# Patient Record
Sex: Female | Born: 1975 | Race: White | Hispanic: No | Marital: Married | Smoking: Never smoker
Health system: Southern US, Community
[De-identification: ages and names within clinical notes are randomized; demographics above are authoritative.]

## PROBLEM LIST (undated history)

## (undated) DIAGNOSIS — F32A Depression, unspecified: Secondary | ICD-10-CM

## (undated) DIAGNOSIS — F329 Major depressive disorder, single episode, unspecified: Secondary | ICD-10-CM

---

## 2012-08-02 ENCOUNTER — Other Ambulatory Visit (HOSPITAL_COMMUNITY): Payer: Self-pay | Admitting: Specialist

## 2012-08-02 DIAGNOSIS — O09529 Supervision of elderly multigravida, unspecified trimester: Secondary | ICD-10-CM

## 2012-08-02 DIAGNOSIS — Z3689 Encounter for other specified antenatal screening: Secondary | ICD-10-CM

## 2012-08-02 DIAGNOSIS — Z818 Family history of other mental and behavioral disorders: Secondary | ICD-10-CM

## 2012-08-30 ENCOUNTER — Other Ambulatory Visit: Payer: Self-pay

## 2012-09-24 ENCOUNTER — Encounter (HOSPITAL_COMMUNITY): Payer: Self-pay | Admitting: Specialist

## 2012-10-01 ENCOUNTER — Ambulatory Visit (HOSPITAL_COMMUNITY)
Admission: RE | Admit: 2012-10-01 | Discharge: 2012-10-01 | Disposition: A | Discharge status: Home / Self Care | Payer: No Typology Code available for payment source | Source: Ambulatory Visit | Attending: *Deleted | Admitting: *Deleted

## 2012-10-01 ENCOUNTER — Encounter (HOSPITAL_COMMUNITY): Payer: Self-pay

## 2012-10-01 ENCOUNTER — Ambulatory Visit (HOSPITAL_COMMUNITY)
Admission: RE | Admit: 2012-10-01 | Discharge: 2012-10-01 | Disposition: A | Discharge status: Home / Self Care | Payer: No Typology Code available for payment source | Source: Ambulatory Visit | Attending: Specialist | Admitting: Specialist

## 2012-10-01 VITALS — BP 126/70 | HR 89 | Wt 257.0 lb

## 2012-10-01 DIAGNOSIS — Z818 Family history of other mental and behavioral disorders: Secondary | ICD-10-CM

## 2012-10-01 DIAGNOSIS — Z3689 Encounter for other specified antenatal screening: Secondary | ICD-10-CM

## 2012-10-01 DIAGNOSIS — O09529 Supervision of elderly multigravida, unspecified trimester: Secondary | ICD-10-CM | POA: Insufficient documentation

## 2012-10-01 NOTE — Progress Notes (Signed)
Tamara Kirby  was seen today for an ultrasound appointment.  See full report in [facility].  [name] was seen today due to advanced maternal age and history of previous IUFD.  See separate consult for further recommendations.  The patient reports that she had a low risk NIPT (Harmony test).  Limited views of the fetal heart and face were obtained due to maternal body habitus and fetal position.  Single IUP at 18 0/7 weeks No fetal anomalies detected.  Limited views of the heart and face were obtained. No markers associated with aneuploidy noted. A right lateral placenta previa is noted (transbadomnal) Normal amniotic fluid volume  Recommend follow-up ultrasound examination in 4 weeks to reevaluate the fetal heart and face. Will reevaluate placenta location at that time.   Alpha Gula, MD

## 2012-10-01 NOTE — Progress Notes (Signed)
Maternal Fetal Medicine Consultation  Requesting Provider(s): [facility], [place]  Primary OB: Dr Wiliam Ke Reason for consultation: AMA, and previous IUFD at 34 weeks.  HPI:37 yo G3P1101 at  18+ weeks, by LMP c/w 1st trimester U/S, with AMA, and previous IUFD at 34 weeks.  OB History: G1 FT C/S failed induction, 7.10lbs, no complications. G2 34 weeks C/S: per patient request. IUFD. IUFD: Working as a Engineer, civil (consulting), was on duty for several weeks and felt shortness of breath, palpitations, and fatigued.  Was at work and went to ER for feeling abnormal symptoms and sob and was d/ch home stable.  Denied fever or flu like illness. Denied any VB or trauma in the pregnancy. Knew something was wrong and began to notice decreased fetal movement, and notified her OB who ordered a NST (patient reported that it was 'fine').   the next day IUFD was documented because it had not moved from the time she left the Dr office.  Born [place], [place], and doesn't remember a phospholipid work up being performed.   PMH: Denies any bleeding or clotting disorders, depression and anxiety, palpitations. Is followed by counselor, and denies suicidal or homicidal ideation. States she is happy, but anxious about pregnancy secondary to previous outcome.  PSH: Adenoids, Tues in ears, ear drum repair-hole from tubes, CS x2 Meds: Celexa 40mg , Zantacand Zyrtec OTC. PNV  Allergies: NKDA FH: sister in laws baby died at 3 months- from an unknown syndrome. Had normal appearing facies but was sick. Soc: Denies tobacco, ethanol, and drug use. Married and works as a Engineer, civil (consulting).   Review of Systems: no vaginal bleeding or cramping/contractions, no LOF, no nausea/vomiting. All other systems reviewed and are negative.  PNL: see chart, TSH normal 2.16   PE:  VS: BP                    pulse                   weight GEN: well-appearing female ABD: gravid, NT  Please see separate document for fetal ultrasound report.  A/P: 37  yo G3P1101 at  18+0/7 weeks, by LMP c/w 1st trimester U/S, with AMA, palpitations, and previous IUFD at 34 weeks.  Unexplained previous IUFD at 34 weeks: Various possible causes of fetal demise including but not limited to infections, chromosomal and genetic abnormalities, smoking, maternal medical disorders, cord event, placental abruption, pre eclampsia, and others. I have no records or work up for fetal or maternal complications that may have resulted in the fetal death, therefore I cannot fully counsel her or accurate recurrence risk.   The patient was counseled on the recurrence risk based on the IUFD being unexplained, and recommendations were made for :   -Antiphospholipid antibodies (lupus anticoagulant, anticardiolipin antibodies, Beta 2 glycoprotein antibodies) studies were reviewed and found to be normal range. We do not recommend heparin or Lovenox at this time.  -If phospholipid antibody tests return, we can consider Asprin 81mg  daily. Based on limited data, there appears to be some benefit if initiated in early pregnancy. She understands our discussion of risks and unproven benefits.  -Fetal surveillance should be with interval serial growth ultrasounds (every 4-6 weeks). If unable to completely evaluate fetal heart, we recommend fetal echocardiography. -Antepartum testing to begin at 28 weeks, as a weekly BPP. No later than 32 weeks change monitoring to twice weekly non-stress tests, with weekly AFI, or twice weekly biophysical profile assessment. Testing may be initiated prior  to stated dates if any abnormalities of growth, fluid, placental blood flow, or worsening maternal status are discovered.  -Fetal kick counts to begin at 24-26 weeks, were reviewed as well as documentation of at least 4-6 movements/hour, with presentation to L&D with any abnormality in normal movements or a decrease in amount of activity.  -We do not recommend delivery prior to 39 weeks in the absence of other  complications or in the absence of evaluation of positive fetal lung maturity. However, if extreme anxiety develops, as well as emotional instability, we would recommend delivery at 37-38 weeks.   Palpitations:  At this time, we recommend Holter monitoring and referral to adult cardiology for further management.  If abnormal CV, pulmonary, or CNS symptoms develop, she was advised to go immediately to the ER for evaluation.   AMA: Awaiting Harmony cffDNA testing, which the patient reported was negative.  Patient is scheduled to have a consultation with our [facility] today. Please refer to their letter for further details.  Case reviewed with Dr Claudean Severance.  Thank you for the opportunity to be a part of the care of Tamara Kirby.  We will continue to follow her with ultrasounds in our [facility]. Please contact our office if we can be of further assistance.   I spent approximately 30 minutes with this patient with over 50% of time spent in face-to-face counseling.

## 2012-10-01 NOTE — Progress Notes (Addendum)
Genetic Counseling  High-Risk Gestation Note  Appointment Date: 10/01/2012  Referred By: Roland Earl, MD  Date of Birth: [birth year] Partner: Tamara Kirby   Pregnancy History: G3P2A0 Estimated Date of Delivery: 03/04/13  Estimated Gestational Age: [redacted]w[redacted]d  Attending: Alpha Gula, MD  [name], and her partner, [name], were seen for genetic counseling regarding a maternal age of 13 and family history concerns.  They were counseled regarding maternal age and the association with risk for chromosome conditions due to nondisjunction with aging of the ova.   We reviewed chromosomes, nondisjunction, and the associated 1 in 100 risk for fetal aneuploidy related to a maternal age of 89 at [redacted]w[redacted]d weeks gestation. They were counseled that the risk for aneuploidy decreases as gestational age increases, accounting for those pregnancies which spontaneously abort.  We specifically discussed Down syndrome (trisomy 59), trisomies 45 and 75, and sex chromosome aneuploidies (47,XXX and 47,XXY) including the common features and prognoses of each.   We also reviewed Ms. McDowell's noninvasive prenatal testing result (NIPT).  [name] had Harmony through [facility].  We reviewed that this technology analyzes cell free fetal DNA found in the maternal circulation. They were counseled that NIPT (Harmony) is not diagnostic for fetal aneuploidy, but can provide information regarding the presence or absence of extra or missing fetal DNA for chromosomes 13, 18, 21, and X and Y.  It does not identify or rule out all genetic conditions. We reviewed the specific detection and false positive rates for each condition the technology evaluates.  We discussed other available screening and diagnostic options including detailed ultrasound and amniocentesis. They were counseled that ~50-80% of fetuses with Down syndrome and up to 90-95% of fetuses with trisomy 18/13, when well visualized, have  detectable anomalies or soft markers by detailed ultrasound (~18+ weeks gestation). They were then counseled regarding diagnostic testing via amniocentesis.  We reviewed the approximate 1 in 300-500 risk for complications for amniocentesis, including spontaneous pregnancy loss. We discussed the risks, limitations, and benefits of each screening and testing option.  Given the normal Harmony result, they elected to proceed with a detailed anatomy ultrasound, but declined amniocentesis. A detailed ultrasound was performed today.  The report will be documented separately.  [name] was provided with written information regarding cystic fibrosis (CF) including the carrier frequency and incidence in the Caucasian population, the availability of carrier testing and prenatal diagnosis if indicated.  In addition, we discussed that CF is routinely screened for as part of the [place] newborn screening panel.  She declined testing today.   Both family histories were reviewed and found to be contributory.  Tamara Kirby reported that his brother has a chromosome condition involving chromosomes 9 and 14.  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  We reviewed chromosomes and reciprocal translocations including both balanced and unbalanced states.  Medical records were not available to verify the reported history.  This relative reportedly has severe mental retardation and dysmorphic features.  It is uncertain whether he has been followed by a geneticist in the past or where his chromosome analysis was performed.  Tamara Kirby agreed to discuss this history with his family and to obtain records if possible.  He remembers being told that the condition occurred sporadically.  He is not certain if his parents had chromosome testing.  We also discussed the option of peripheral blood chromosome analysis to determine if Tamara Kirby has an abnormal karyotype.  He understands  that  without more information, an accurate risk assessment for the fetus cannot be provided.  This couple was provided with CPT codes for peripheral blood chromosome analysis to determine if the testing is covered by their insurance plan.  They agreed to contact me if they are able to provide medical records from the affected relative or if Tamara Kirby is interested in pursuing chromosome analysis for himself.    In addition, [name] reported that her brother had a son who died at 53 months of age from complications of hypoparathyroidism.  By report, this relative had Kenny-Caffey syndrome.  We discussed that this is a rare autosomal recessive condition characterized by intrauterine and postnatal growth restriction, endocrine abnormalities (hypoparathyroidism), recurrent bacterial infections, and bone abnormalities.  Given the reported family history, [name] has a 1:2 chance of being a carrier of the condition.  It is not known whether or not the deceased child had genetic testing for Kenny-Caffey or if his diagnosis was based on clinical features.  We discussed the option of genetic testing.  Given that the general population carrier frequency is less than 1:100, the risk to the fetus is estimated to be <1:800.  After thoughtful consideration of this information, this couple declined further discussion and testing at this time.  They were encouraged to notify their child's pediatrician about this family history because of the associated risk for neonatal hypoparathyroidism and hypocalcemia.  The remainder of the family history was noncontributory for birth defects, mental retardation, and known genetic conditions. Without further information regarding the provided family history, an accurate genetic risk cannot be calculated. Further genetic counseling is warranted if more information is obtained.  [name] denied exposure to environmental toxins or chemical agents. She denied the use of alcohol,  tobacco or street drugs. She denied significant viral illnesses during the course of her pregnancy. [name] reported that she had a 35 week stillbirth.  She met with Dr. Alinda Dooms for MFM consult today.  Please see that note for a complete review of this patient's medical/pregnancy history.  I counseled this couple regarding the above risks and available options.  The approximate face-to-face time with the genetic counselor was 48 minutes.  Donald Prose, MS Certified Genetic Counselor

## 2012-10-02 LAB — LUPUS ANTICOAGULANT PANEL: Lupus Anticoagulant: NOT DETECTED

## 2012-10-02 LAB — BETA-2-GLYCOPROTEIN I ABS, IGG/M/A
Beta-2 Glyco I IgG: 0 G Units (ref <20)
Beta-2-Glycoprotein I IgA: 4 A Units (ref <20)
Beta-2-Glycoprotein I IgM: 6 M Units (ref <20)

## 2012-10-02 LAB — CARDIOLIPIN ANTIBODIES, IGG, IGM, IGA: Anticardiolipin IgA: 2 APL U/mL — ABNORMAL LOW (ref <22)

## 2012-10-09 ENCOUNTER — Telehealth (HOSPITAL_COMMUNITY): Payer: Self-pay | Admitting: *Deleted

## 2012-10-09 NOTE — Telephone Encounter (Signed)
Called pt with her lab results.  Reviewed with Dr. Sherrie George and found to be wnl.  Pt verbalized understanding.

## 2012-10-29 ENCOUNTER — Other Ambulatory Visit (HOSPITAL_COMMUNITY): Payer: Self-pay | Admitting: Maternal and Fetal Medicine

## 2012-10-29 ENCOUNTER — Ambulatory Visit (HOSPITAL_COMMUNITY)
Admission: RE | Admit: 2012-10-29 | Discharge: 2012-10-29 | Disposition: A | Discharge status: Home / Self Care | Payer: No Typology Code available for payment source | Source: Ambulatory Visit | Attending: Specialist | Admitting: Specialist

## 2012-10-29 VITALS — BP 122/65 | HR 89 | Wt 263.0 lb

## 2012-10-29 DIAGNOSIS — Z3689 Encounter for other specified antenatal screening: Secondary | ICD-10-CM

## 2012-10-29 DIAGNOSIS — O09529 Supervision of elderly multigravida, unspecified trimester: Secondary | ICD-10-CM | POA: Insufficient documentation

## 2012-10-29 DIAGNOSIS — O09299 Supervision of pregnancy with other poor reproductive or obstetric history, unspecified trimester: Secondary | ICD-10-CM | POA: Insufficient documentation

## 2012-10-29 DIAGNOSIS — O34219 Maternal care for unspecified type scar from previous cesarean delivery: Secondary | ICD-10-CM | POA: Insufficient documentation

## 2012-10-29 DIAGNOSIS — O4400 Placenta previa specified as without hemorrhage, unspecified trimester: Secondary | ICD-10-CM

## 2012-10-29 DIAGNOSIS — Z818 Family history of other mental and behavioral disorders: Secondary | ICD-10-CM

## 2012-10-29 NOTE — Progress Notes (Signed)
Tamara Kirby  was seen today for an ultrasound appointment.  See full report in [facility].  Impression Single IUP at 22 0/7 weeks Normal fetal growth for gestational age 36%-tile. No fetal anomalies detected.   Limited views of the fetal face were obtained. No markers associated with aneuploidy noted. A right lateral placenta previa is noted  Normal amniotic fluid volume.  Recommendations Recommend follow-up ultrasound examination in 4 weeks to reevaluate the fetal face, and growth. Will reevaluate placenta location with transvaginal imaging at that time.  Alpha Gula, MD

## 2012-11-26 ENCOUNTER — Ambulatory Visit (HOSPITAL_COMMUNITY)
Admission: RE | Admit: 2012-11-26 | Discharge: 2012-11-26 | Disposition: A | Discharge status: Home / Self Care | Payer: No Typology Code available for payment source | Source: Ambulatory Visit | Attending: Specialist | Admitting: Specialist

## 2012-11-26 ENCOUNTER — Encounter (HOSPITAL_COMMUNITY): Payer: Self-pay

## 2012-11-26 DIAGNOSIS — O09529 Supervision of elderly multigravida, unspecified trimester: Secondary | ICD-10-CM | POA: Insufficient documentation

## 2012-11-26 DIAGNOSIS — O09299 Supervision of pregnancy with other poor reproductive or obstetric history, unspecified trimester: Secondary | ICD-10-CM | POA: Insufficient documentation

## 2012-11-26 DIAGNOSIS — O34219 Maternal care for unspecified type scar from previous cesarean delivery: Secondary | ICD-10-CM | POA: Insufficient documentation

## 2012-11-26 DIAGNOSIS — Z3689 Encounter for other specified antenatal screening: Secondary | ICD-10-CM

## 2012-11-26 NOTE — Progress Notes (Signed)
[facility]  Indication: 37 yr old G63P1101 at [redacted]w[redacted]d with history of fetal demise for follow up fetal growth and evaluate placental location.  Findings: 1. Single intrauterine pregnancy. 2. Estimated fetal weight is in the 68th%. 3. Right lateral placenta without evidence of previa. 4. Normal amniotic fluid volume. 5. Normal transvaginal cervical length. 6. The limited anatomy survey is normal.  Recommendations: 1. Appropriate fetal growth. 2. Previous fetal demise: - previously counseled - antiphospholipid antibody work up negative - recommend fetal growth every 4 weeks - recommend antenatal testing starting at 28-32 weeks as previously recommended; patient prefers BPPs - do not recommend delivery prior to 39 weeks without other complications; if there is patient anxiety can consider delivery at 37-39 weeks with amniocentesis for fetal lung maturity 3. Placenta previa has resolved 4. Advanced maternal age: - previously counseled - had normal Harmony screen  Eulis Foster, MD

## 2013-08-06 ENCOUNTER — Encounter (HOSPITAL_COMMUNITY): Payer: Self-pay | Admitting: *Deleted

## 2014-01-13 ENCOUNTER — Encounter (HOSPITAL_COMMUNITY): Payer: Self-pay | Admitting: Specialist

## 2014-01-13 ENCOUNTER — Other Ambulatory Visit (HOSPITAL_COMMUNITY): Payer: Self-pay | Admitting: Specialist

## 2014-01-13 DIAGNOSIS — O09529 Supervision of elderly multigravida, unspecified trimester: Secondary | ICD-10-CM

## 2014-01-13 DIAGNOSIS — Z3689 Encounter for other specified antenatal screening: Secondary | ICD-10-CM

## 2014-01-14 ENCOUNTER — Encounter (HOSPITAL_COMMUNITY): Payer: Self-pay | Admitting: Specialist

## 2014-01-21 ENCOUNTER — Other Ambulatory Visit: Payer: Self-pay

## 2014-02-25 ENCOUNTER — Ambulatory Visit (HOSPITAL_COMMUNITY): Admission: RE | Admit: 2014-02-25 | Payer: No Typology Code available for payment source | Source: Ambulatory Visit

## 2014-02-25 ENCOUNTER — Ambulatory Visit (HOSPITAL_COMMUNITY)
Admission: RE | Admit: 2014-02-25 | Discharge: 2014-02-25 | Disposition: A | Discharge status: Home / Self Care | Payer: No Typology Code available for payment source | Source: Ambulatory Visit | Attending: *Deleted | Admitting: *Deleted

## 2014-02-25 ENCOUNTER — Other Ambulatory Visit (HOSPITAL_COMMUNITY): Payer: Self-pay | Admitting: *Deleted

## 2014-02-25 ENCOUNTER — Ambulatory Visit (HOSPITAL_COMMUNITY)
Admission: RE | Admit: 2014-02-25 | Discharge: 2014-02-25 | Disposition: A | Discharge status: Home / Self Care | Payer: No Typology Code available for payment source | Source: Ambulatory Visit | Attending: Specialist | Admitting: Specialist

## 2014-02-25 ENCOUNTER — Encounter (HOSPITAL_COMMUNITY): Payer: Self-pay

## 2014-02-25 ENCOUNTER — Encounter (HOSPITAL_COMMUNITY): Payer: Self-pay | Admitting: *Deleted

## 2014-02-25 DIAGNOSIS — O09529 Supervision of elderly multigravida, unspecified trimester: Secondary | ICD-10-CM

## 2014-02-25 DIAGNOSIS — Z363 Encounter for antenatal screening for malformations: Secondary | ICD-10-CM | POA: Insufficient documentation

## 2014-02-25 DIAGNOSIS — O09299 Supervision of pregnancy with other poor reproductive or obstetric history, unspecified trimester: Secondary | ICD-10-CM | POA: Insufficient documentation

## 2014-02-25 DIAGNOSIS — IMO0002 Reserved for concepts with insufficient information to code with codable children: Secondary | ICD-10-CM | POA: Insufficient documentation

## 2014-02-25 DIAGNOSIS — Z1389 Encounter for screening for other disorder: Secondary | ICD-10-CM | POA: Insufficient documentation

## 2014-02-25 DIAGNOSIS — Z810 Family history of intellectual disabilities: Secondary | ICD-10-CM | POA: Insufficient documentation

## 2014-02-25 DIAGNOSIS — Z0489 Encounter for examination and observation for other specified reasons: Secondary | ICD-10-CM

## 2014-02-25 DIAGNOSIS — Z3689 Encounter for other specified antenatal screening: Secondary | ICD-10-CM

## 2014-02-25 NOTE — Progress Notes (Signed)
Appointment Date: 02/25/2014 Referred By: Belva CromeEnsminger, Jason, MD DOB: 01/05/1976 Attending: Dr. Berna SpareJosh Nitsche   Mrs. Tamara Kirby, and her husband, [name], were seen for genetic counseling regarding a maternal age of 38 y.o.Marland Kitchen.  They were counseled regarding maternal age and the association with risk for chromosome conditions due to nondisjunction.   We reviewed chromosomes, nondisjunction, and the associated 1 in 10488 risk for fetal aneuploidy related to a maternal age of 38 y.o. at 4157w0d weeks gestation.  They were counseled that the risk for aneuploidy decreases as gestational age increases, accounting for those pregnancies which spontaneously abort.  We specifically discussed Down syndrome (trisomy 3521), trisomies 6613 and 7418, and sex chromosome aneuploidies (47,XXX and 47,XXY) including the common features and prognoses of each.   We also reviewed Mrs. Tamara Kirby non-invasive prenatal screening (NIPS) result.  Mrs. Tamara Kirby had noninvasive prenatal screening (NIPS)/cell free fetal DNA (cffDNA) testing, specifically InformaSeq testing, through her primary OB/gyn office.  This result revealed no aneuploidy detected for chromosomes 21, 13, and 18.   In addition there was no sex chromosome aneuploidy identified. The fetal sex was predicted to be female.  We reviewed these results and the associated reduction her age related risks.    They were counseled regarding other available screening and diagnostic options including ultrasound and amniocentesis.  The risks, benefits, and limitations of each of these options were reviewed in detail.  After thoughtful consideration of these options, they declined amniocentesis.  They understand that screening tests cannot rule out all birth defects or genetic syndromes.  The patient was advised of this limitation and states she still does not want diagnostic testing at this time. They were counseled that 50-80% of fetuses with Down syndrome and up to 90% of fetuses  with trisomies 13 and 18, when the fetal anatomy is well visualized, have detectable anomalies or soft markers by ultrasound. A complete detailed ultrasound was performed today.  The ultrasound report will be sent under separate cover.  Mrs. Tamara Kirby was provided with written information regarding cystic fibrosis (CF) including the carrier frequency and incidence in the Caucasian population, the availability of carrier testing and prenatal diagnosis if indicated.  In addition, we discussed that CF is routinely screened for as part of the [place] newborn screening panel.  She declined testing today.   Both family histories were reviewed.  Mrs. Tamara Kirby reported that her brother has a child that passed away at 234 months of age.  She reported that this child had a rare disease, but could not recall the name.  Additionally, [name] reported that his brother has significant mental retardation and has a "broken chromosome" .  We discussed that these can be inherited or happen for the first time in an individual.  [name] stated that they were told that his brother's just happened at random and it was not from his parents.  They also reported that their first child was stillborn at 4034 weeks.  After delivery their son was non-dysmorphic and Mrs. Tamara Kirby was told it may have been due to an infection.  She had an MD consult during her last pregnancy regarding this issue, therefore we did not discuss it in detail today.  The remainder of the family histories were found to be noncontributory for birth defects, mental retardation, and known genetic conditions. Without further information regarding the provided family history, an accurate genetic risk cannot be calculated. Further genetic counseling is warranted if more information is obtained.  Mrs. Tamara Kirby denied  exposure to environmental toxins or chemical agents. She denied the use of alcohol, tobacco or street drugs. She denied significant viral illnesses during the course  of her pregnancy. Her medical and surgical histories were noncontributory.    I counseled this couple regarding the above risks and available options.  The approximate face-to-face time with the genetic counselor was 45 minutes.  Tamara Gemma, MS Certified Genetic Counselor

## 2014-04-08 ENCOUNTER — Encounter (HOSPITAL_COMMUNITY): Payer: Self-pay

## 2014-04-08 ENCOUNTER — Ambulatory Visit (HOSPITAL_COMMUNITY)
Admission: RE | Admit: 2014-04-08 | Discharge: 2014-04-08 | Disposition: A | Discharge status: Home / Self Care | Payer: No Typology Code available for payment source | Source: Ambulatory Visit | Attending: *Deleted | Admitting: *Deleted

## 2014-04-08 DIAGNOSIS — Z0489 Encounter for examination and observation for other specified reasons: Secondary | ICD-10-CM

## 2014-04-08 DIAGNOSIS — IMO0002 Reserved for concepts with insufficient information to code with codable children: Secondary | ICD-10-CM

## 2014-04-08 DIAGNOSIS — Z1389 Encounter for screening for other disorder: Secondary | ICD-10-CM | POA: Insufficient documentation

## 2014-04-08 DIAGNOSIS — Z363 Encounter for antenatal screening for malformations: Secondary | ICD-10-CM | POA: Insufficient documentation

## 2014-05-27 ENCOUNTER — Encounter (HOSPITAL_COMMUNITY): Payer: Self-pay

## 2014-05-27 ENCOUNTER — Ambulatory Visit (HOSPITAL_COMMUNITY)
Admission: RE | Admit: 2014-05-27 | Discharge: 2014-05-27 | Disposition: A | Discharge status: Home / Self Care | Payer: No Typology Code available for payment source | Source: Ambulatory Visit | Attending: *Deleted | Admitting: *Deleted

## 2014-05-27 ENCOUNTER — Other Ambulatory Visit (HOSPITAL_COMMUNITY): Payer: Self-pay | Admitting: Specialist

## 2014-05-27 VITALS — BP 123/65 | HR 81 | Wt 268.0 lb

## 2014-05-27 DIAGNOSIS — Z3689 Encounter for other specified antenatal screening: Secondary | ICD-10-CM | POA: Insufficient documentation

## 2014-05-27 DIAGNOSIS — O24410 Gestational diabetes mellitus in pregnancy, diet controlled: Secondary | ICD-10-CM

## 2014-05-27 DIAGNOSIS — O09529 Supervision of elderly multigravida, unspecified trimester: Secondary | ICD-10-CM | POA: Insufficient documentation

## 2014-05-27 DIAGNOSIS — O09523 Supervision of elderly multigravida, third trimester: Secondary | ICD-10-CM

## 2014-05-27 DIAGNOSIS — O409XX0 Polyhydramnios, unspecified trimester, not applicable or unspecified: Secondary | ICD-10-CM | POA: Insufficient documentation

## 2014-05-27 DIAGNOSIS — O99810 Abnormal glucose complicating pregnancy: Secondary | ICD-10-CM | POA: Diagnosis not present

## 2014-05-27 DIAGNOSIS — O403XX0 Polyhydramnios, third trimester, not applicable or unspecified: Secondary | ICD-10-CM

## 2014-05-27 HISTORY — DX: Depression, unspecified: F32.A

## 2014-05-27 HISTORY — DX: Major depressive disorder, single episode, unspecified: F32.9

## 2014-06-09 ENCOUNTER — Other Ambulatory Visit (HOSPITAL_COMMUNITY): Payer: Self-pay | Admitting: Specialist

## 2014-06-09 ENCOUNTER — Ambulatory Visit (HOSPITAL_COMMUNITY): Payer: No Typology Code available for payment source

## 2014-06-09 DIAGNOSIS — O403XX1 Polyhydramnios, third trimester, fetus 1: Secondary | ICD-10-CM

## 2014-06-10 ENCOUNTER — Ambulatory Visit (HOSPITAL_COMMUNITY)
Admission: RE | Admit: 2014-06-10 | Discharge: 2014-06-10 | Disposition: A | Discharge status: Home / Self Care | Payer: No Typology Code available for payment source | Source: Ambulatory Visit | Attending: Specialist | Admitting: Specialist

## 2014-06-10 DIAGNOSIS — O403XX1 Polyhydramnios, third trimester, fetus 1: Secondary | ICD-10-CM

## 2014-06-10 DIAGNOSIS — O409XX0 Polyhydramnios, unspecified trimester, not applicable or unspecified: Secondary | ICD-10-CM | POA: Diagnosis present

## 2014-06-10 LAB — AP-AFP (ALPHA FETOPROTEIN)

## 2014-06-10 LAB — ROUTINE CHROMOSOME - KARYOTYPE

## 2014-06-17 ENCOUNTER — Ambulatory Visit (HOSPITAL_COMMUNITY): Payer: No Typology Code available for payment source

## 2014-06-23 ENCOUNTER — Encounter (HOSPITAL_COMMUNITY): Payer: Self-pay

## 2014-06-23 ENCOUNTER — Ambulatory Visit (HOSPITAL_COMMUNITY)
Admission: RE | Admit: 2014-06-23 | Discharge: 2014-06-23 | Disposition: A | Discharge status: Home / Self Care | Payer: No Typology Code available for payment source | Source: Ambulatory Visit | Attending: *Deleted | Admitting: *Deleted

## 2014-06-23 ENCOUNTER — Other Ambulatory Visit (HOSPITAL_COMMUNITY): Payer: Self-pay | Admitting: Maternal and Fetal Medicine

## 2014-06-23 VITALS — BP 131/77 | HR 89 | Wt 271.0 lb

## 2014-06-23 DIAGNOSIS — O09529 Supervision of elderly multigravida, unspecified trimester: Secondary | ICD-10-CM

## 2014-06-23 DIAGNOSIS — O24113 Pre-existing diabetes mellitus, type 2, in pregnancy, third trimester: Secondary | ICD-10-CM | POA: Insufficient documentation

## 2014-06-23 DIAGNOSIS — O403XX0 Polyhydramnios, third trimester, not applicable or unspecified: Secondary | ICD-10-CM | POA: Insufficient documentation

## 2014-06-23 DIAGNOSIS — E119 Type 2 diabetes mellitus without complications: Secondary | ICD-10-CM | POA: Diagnosis not present

## 2014-06-23 DIAGNOSIS — O09523 Supervision of elderly multigravida, third trimester: Secondary | ICD-10-CM | POA: Diagnosis present

## 2014-06-23 DIAGNOSIS — Z3A00 Weeks of gestation of pregnancy not specified: Secondary | ICD-10-CM | POA: Insufficient documentation

## 2014-06-23 DIAGNOSIS — O24913 Unspecified diabetes mellitus in pregnancy, third trimester: Secondary | ICD-10-CM

## 2014-06-24 ENCOUNTER — Ambulatory Visit (HOSPITAL_COMMUNITY): Payer: No Typology Code available for payment source

## 2014-07-20 ENCOUNTER — Encounter (HOSPITAL_COMMUNITY): Payer: Self-pay

## 2014-12-31 ENCOUNTER — Encounter (HOSPITAL_COMMUNITY): Payer: Self-pay | Admitting: *Deleted

## 2016-05-31 IMAGING — US US OB FOLLOW-UP
1 series · 12 of 28 positions shown · non-contrast
Comparison: none

[Series 1: us ob follow-up · 0.32mm/px · 12 of 52 slices shown]
[im 2/52]
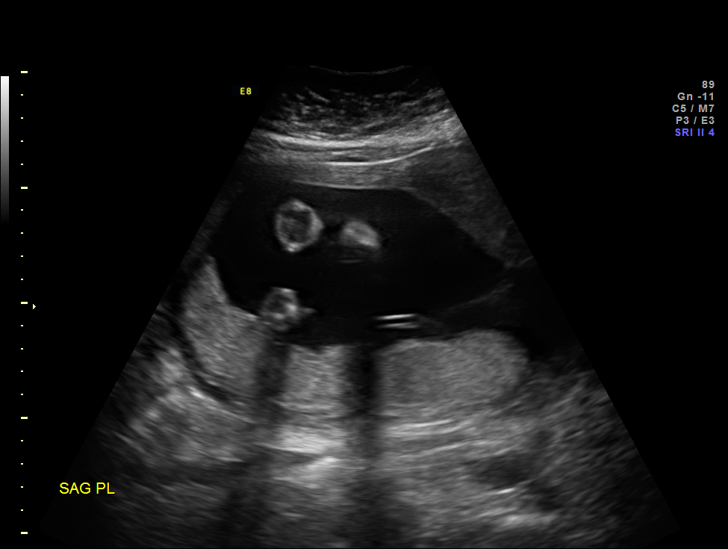
[im 6/52]
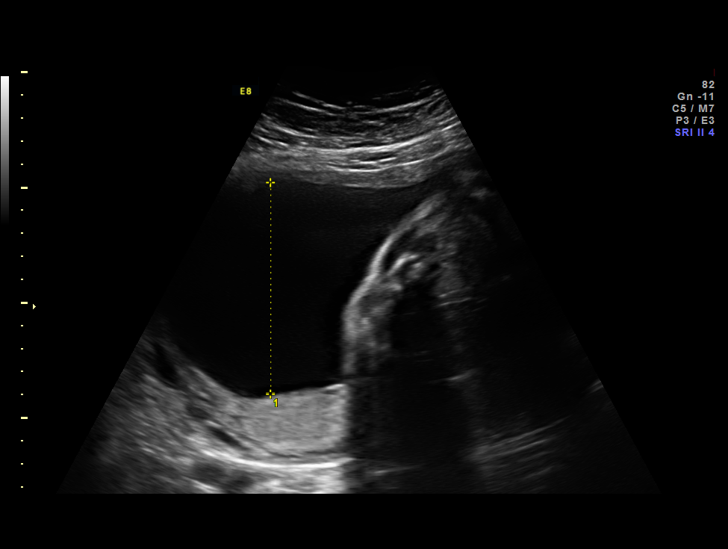
[im 10/52]
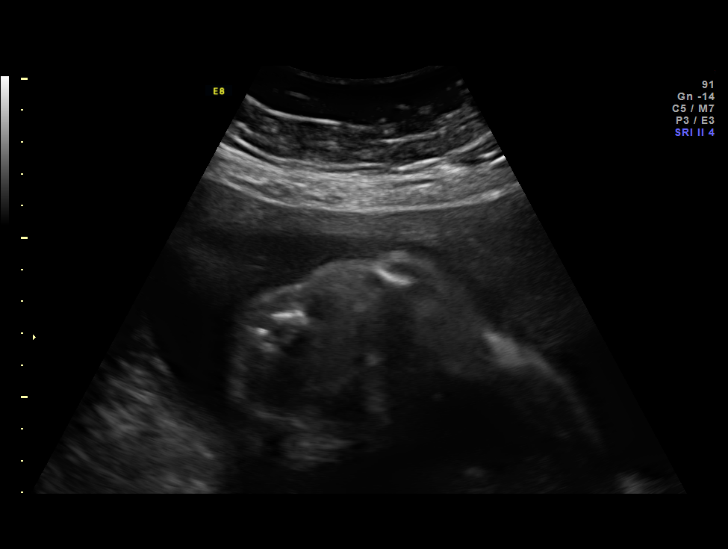
[im 16/52]
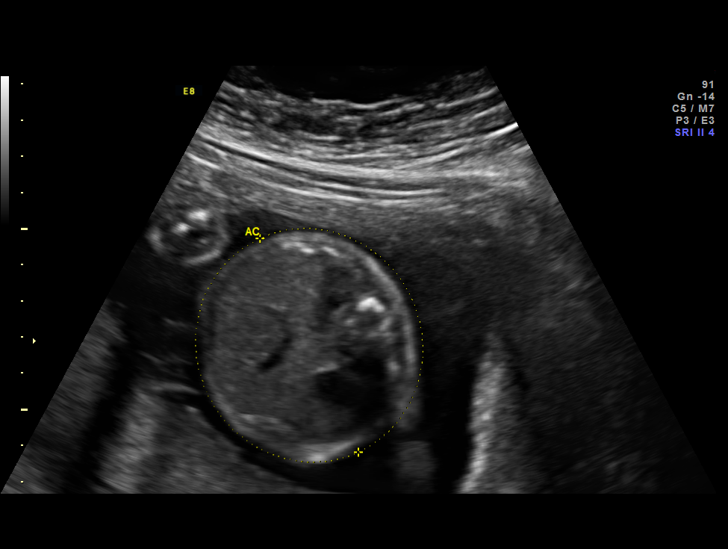
[im 19/52]
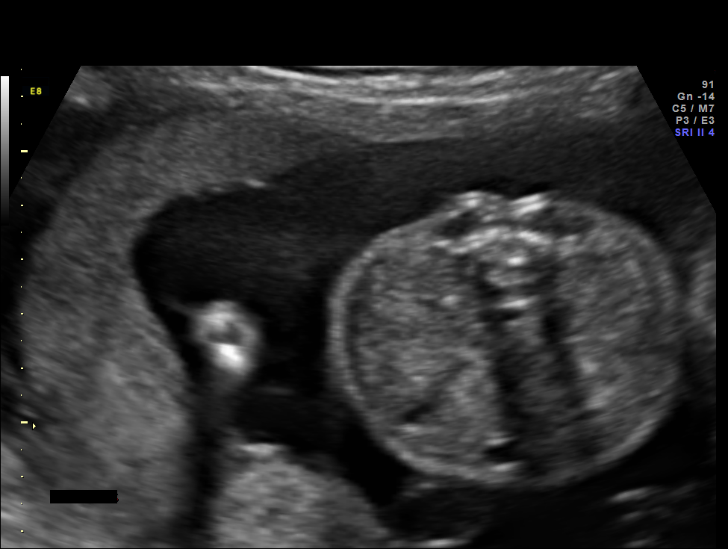
[im 23/52]
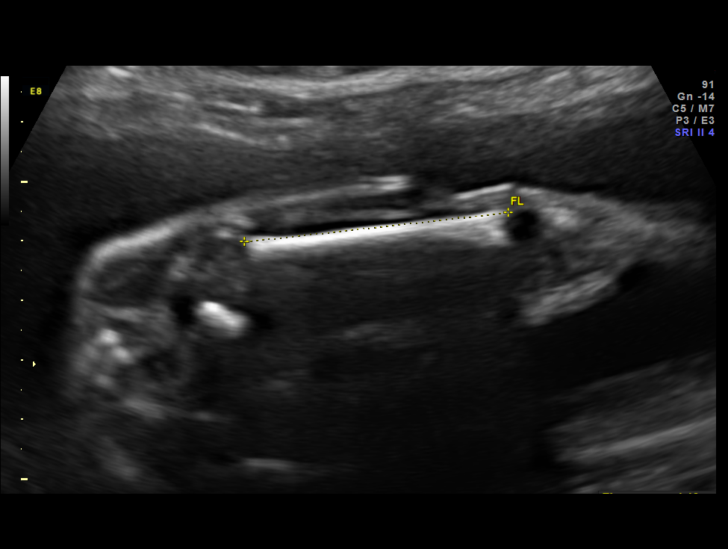
[im 29/52]
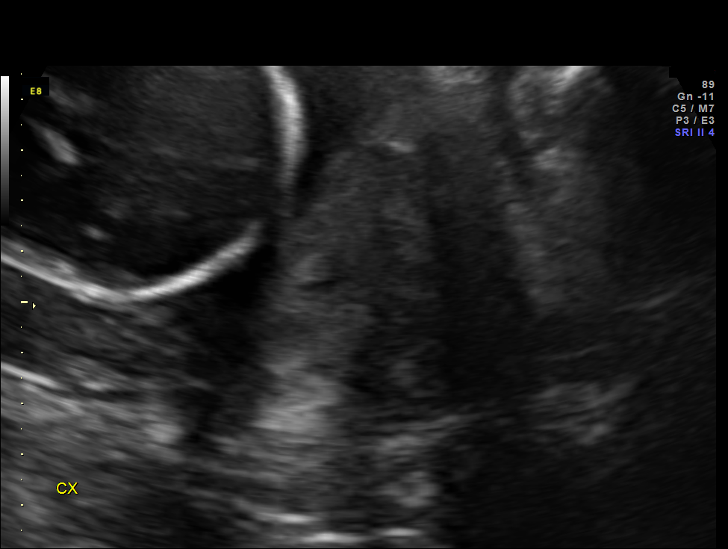
[im 33/52]
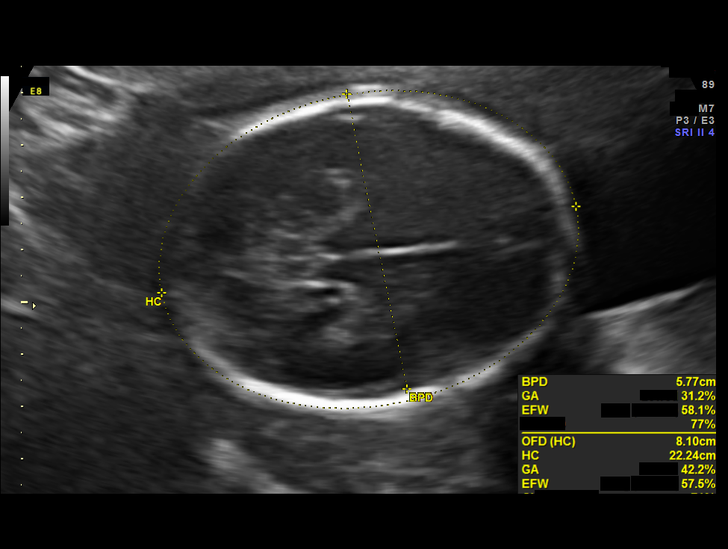
[im 36/52]
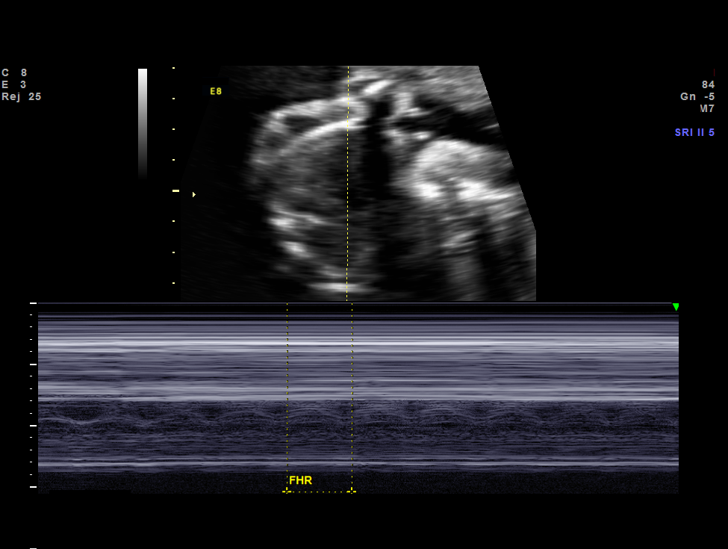
[im 42/52]
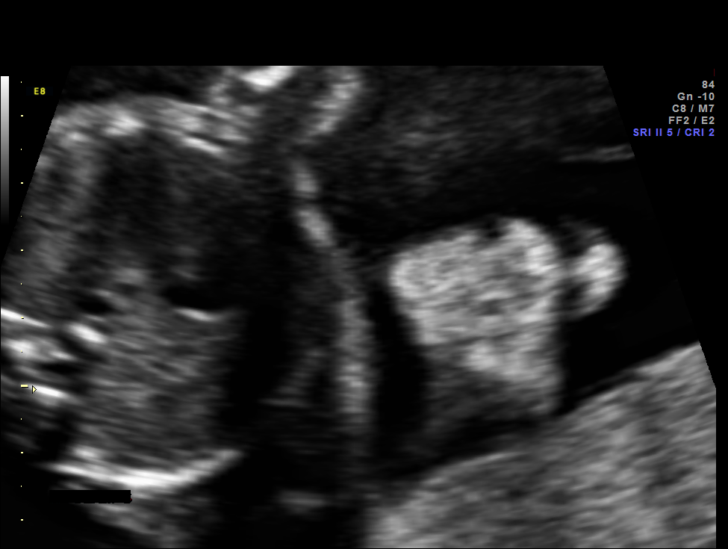
[im 46/52]
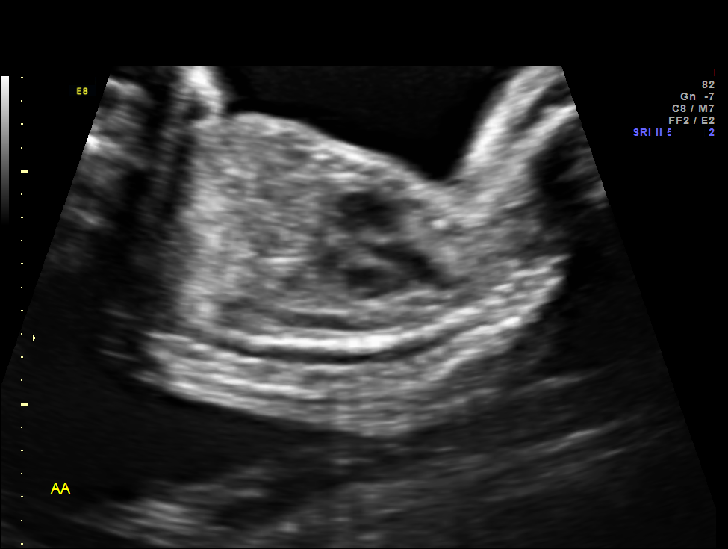
[im 50/52]
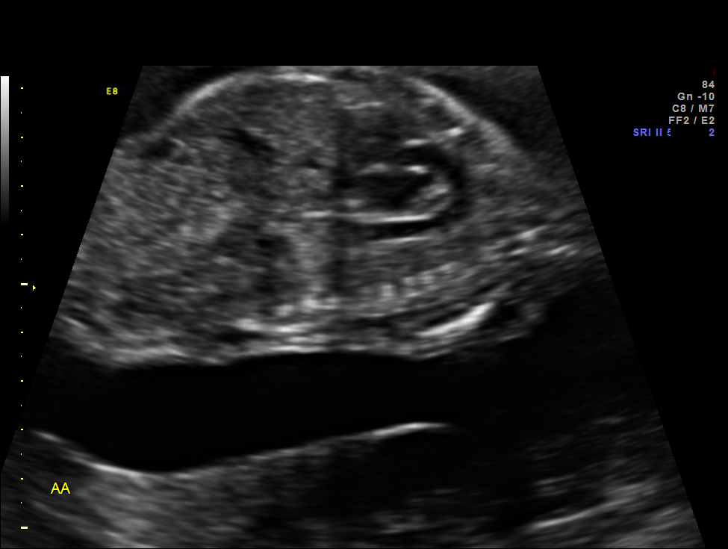

[12 of 28 positions shown; findings below may reference images not displayed]

OBSTETRICS REPORT
                      (Signed Final 04/08/2014 [DATE])

Service(s) Provided

 US OB FOLLOW UP                                       76816.1
Indications

 Poor obstetric history: Previous IUFD (stillbirth)
 Poor obstetric history: Previous gestational
 diabetes
 Previous cesarean section
Fetal Evaluation

 Num Of Fetuses:    1
 Fetal Heart Rate:  155                          bpm
 Cardiac Activity:  Observed
 Presentation:      Cephalic
 Placenta:          Posterior, above cervical
                    os
 P. Cord            Previously Visualized
 Insertion:

 Amniotic Fluid
 AFI FV:      Subjectively within normal limits
                                             Larg Pckt:     9.2  cm
Biometry

 BPD:     58.5  mm     G. Age:  24w 0d                CI:         71.4   70 - 86
 OFD:     81.9  mm                                    FL/HC:      19.4   18.7 -

 HC:     225.3  mm     G. Age:  24w 4d       53  %    HC/AC:      1.12   1.05 -

 AC:     201.7  mm     G. Age:  24w 5d       66  %    FL/BPD:     74.5   71 - 87
 FL:      43.6  mm     G. Age:  24w 2d       48  %    FL/AC:      21.6   20 - 24
 HUM:     40.9  mm     G. Age:  24w 6d       60  %

 Est. FW:     708  gm      1 lb 9 oz     62  %
Gestational Age

 LMP:           24w 0d        Date:  10/22/13                 EDD:   07/29/14
 U/S Today:     24w 3d                                        EDD:   07/26/14
 Best:          24w 0d     Det. By:  LMP  (10/22/13)          EDD:   07/29/14
Anatomy
 Cranium:          Previously seen        Aortic Arch:      Appears normal
 Fetal Cavum:      Previously seen        Ductal Arch:      Appears normal
 Ventricles:       Appears normal         Diaphragm:        Appears normal
 Choroid Plexus:   Previously seen        Stomach:          Appears normal, left
                                                            sided
 Cerebellum:       Previously seen        Abdomen:          Appears normal
 Posterior Fossa:  Previously seen        Abdominal Wall:   Appears nml (cord
                                                            insert, abd wall)
 Nuchal Fold:      Previously seen        Cord Vessels:     Previously seen
 Face:             Orbits and profile     Kidneys:          Appear normal
                   previously seen
 Lips:             Appears normal         Bladder:          Appears normal
 Heart:            Appears normal         Spine:            Appears normal
                   (4CH, axis, and
                   situs)
 RVOT:             Appears normal         Lower             Previously seen
                                          Extremities:
 LVOT:             Appears normal         Upper             Previously seen
                                          Extremities:

 Other:  Fetus appears to be a male.
Cervix Uterus Adnexa

 Cervix:       Normal appearance by transabdominal scan. Appears
               closed, without funnelling.
Impression

 Single living intrauterine pregnancy at 24 weeks 0 days.
 Appropriate fetal growth (62%).
 Normal amniotic fluid volume.
 No gross fetal anomalies identified, noting today's images
 effectively complete our fetal survey.
Recommendations

 Follow up ultrasounds as clinically indicated.

 questions or concerns.

## 2016-08-02 IMAGING — US [facility] ([PERSON_NAME]) FLUID REDUCTION
1 series · 13 of 24 positions shown · non-contrast
Comparison: none

[Series 1: us [facility] amnio ((person_name)) fluid reduction · 0.28mm/px · 13 of 24 slices shown]
[im 1/24]
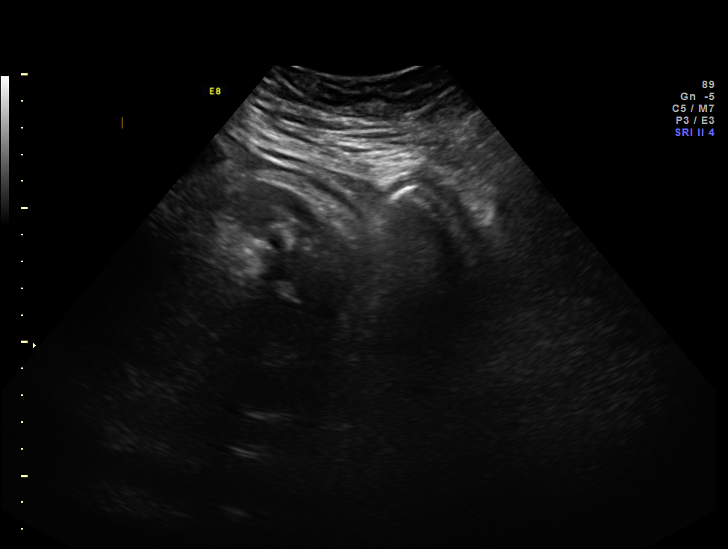
[im 3/24]
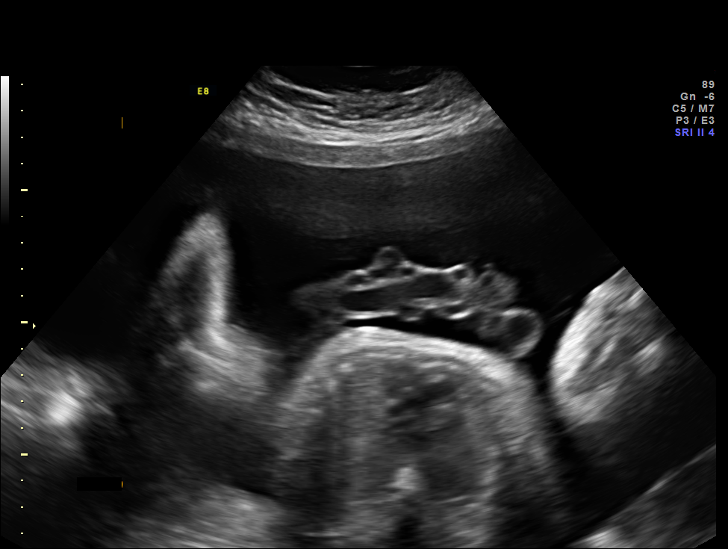
[im 5/24]
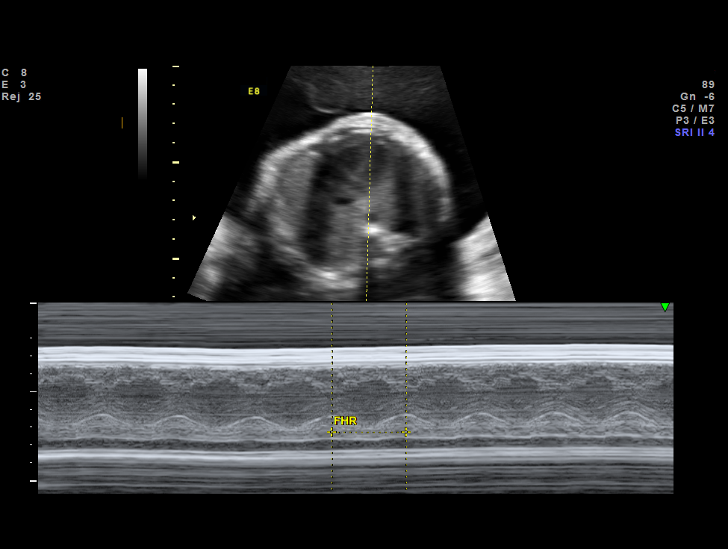
[im 7/24]
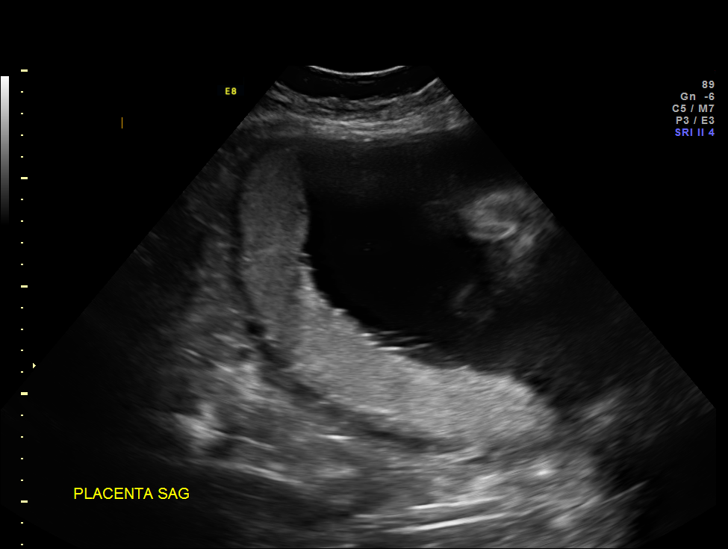
[im 9/24]
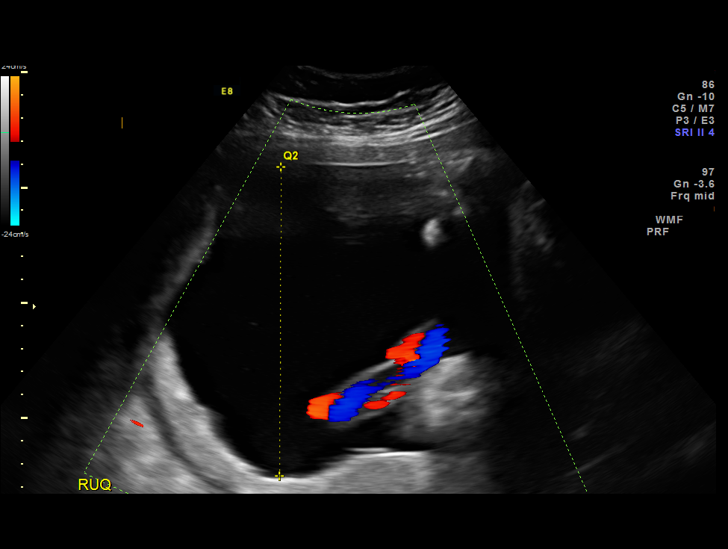
[im 11/24]
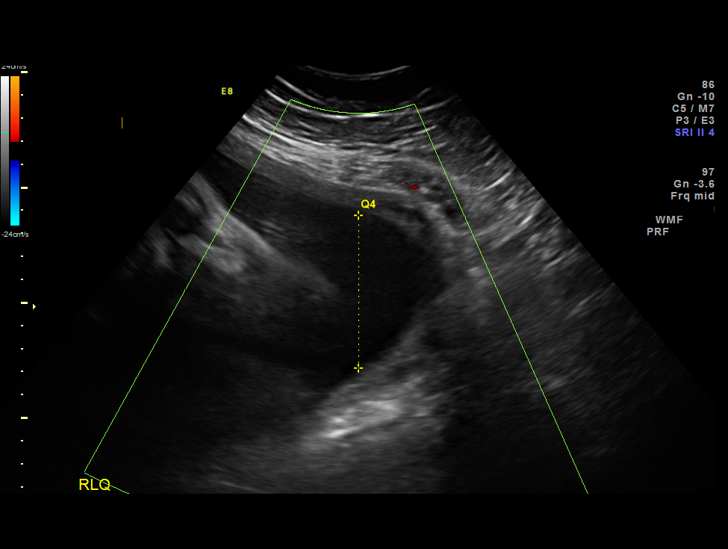
[im 13/24]
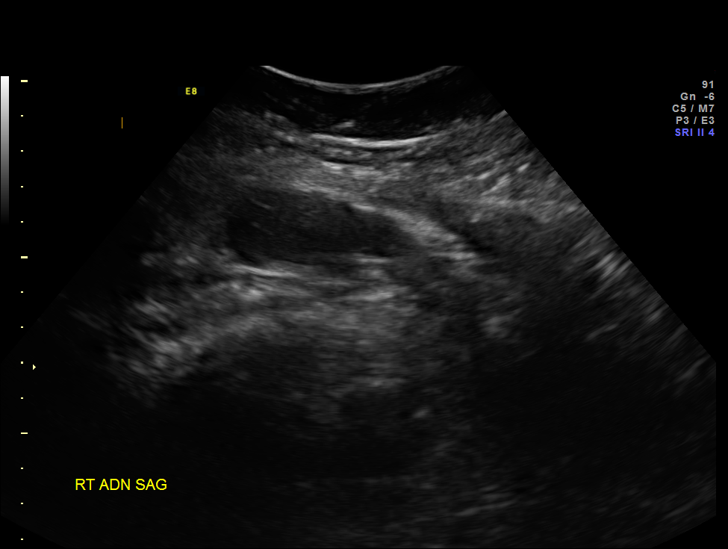
[im 14/24]
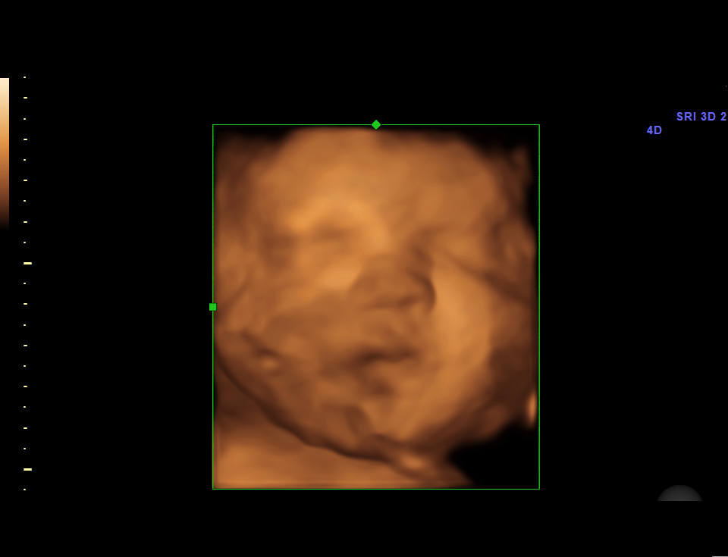
[im 16/24]
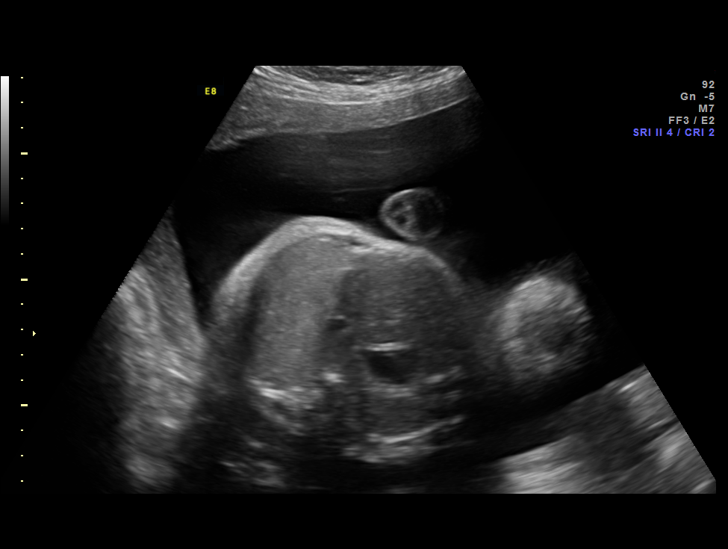
[im 18/24]
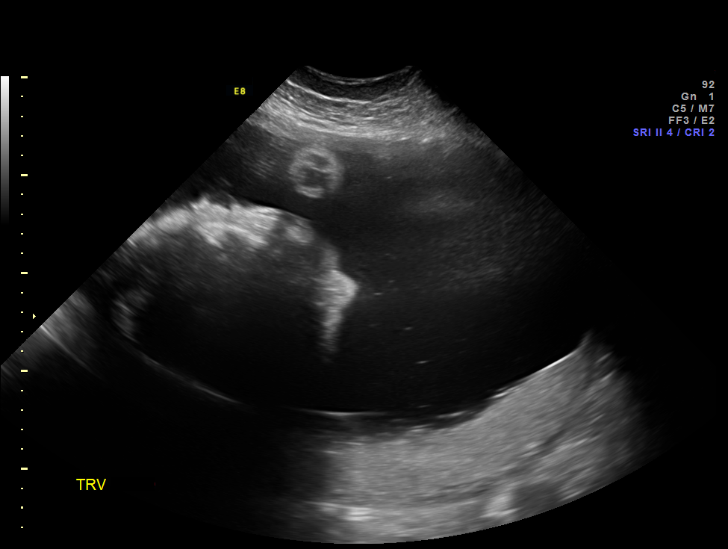
[im 20/24]
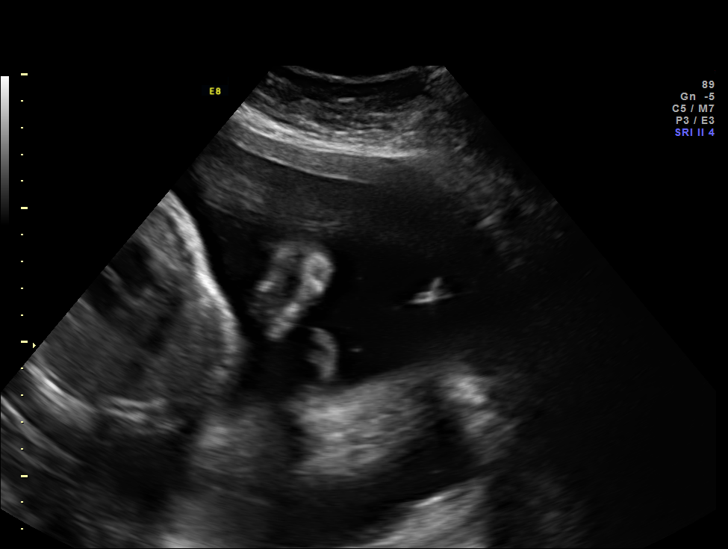
[im 22/24]
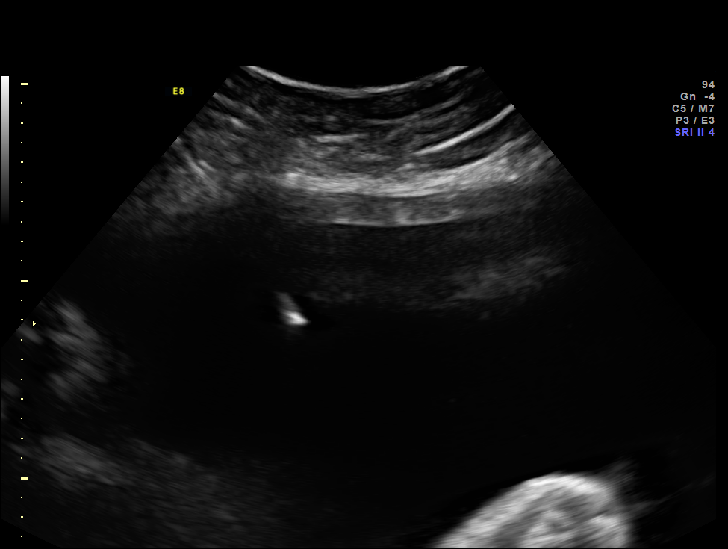
[im 24/24]
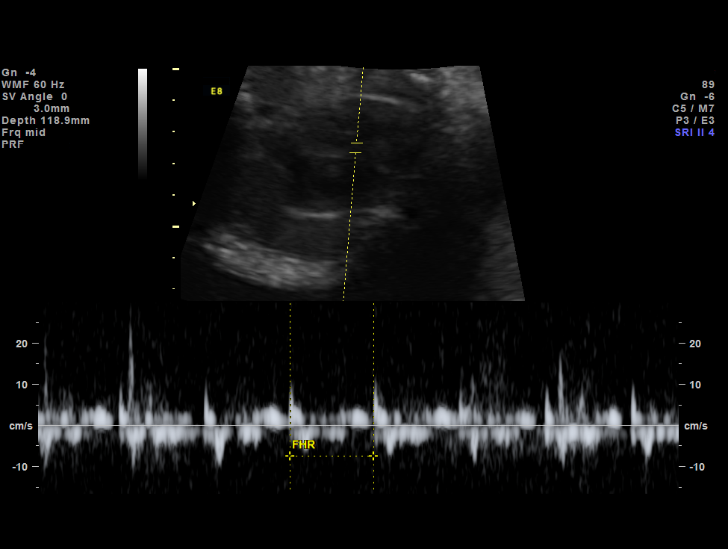

[13 of 24 positions shown; findings below may reference images not displayed]

OBSTETRICS REPORT
                      (Signed Final 06/10/2014 [DATE])

Service(s) Provided

 US AMNIO ([facility]) FLUID REDUCTION                      59001.0
Indications

 Polyhydramnios
 Advanced maternal age (38), Multigravida - low
 risk NIPS
 Diabetes - Gestational, A2 (on metformin)
 Poor obstetric history: Previous IUFD (stillbirth)
 Poor obstetric history: Previous gestational
 diabetes
 Previous cesarean section
Fetal Evaluation

 Num Of Fetuses:    1
 Fetal Heart Rate:  135                          bpm
 Cardiac Activity:  Observed
 Presentation:      Breech
 Placenta:          Posterior, above cervical
                    os
 P. Cord            Previously Visualized
 Insertion:

 Amniotic Fluid
 AFI FV:      Polyhydramnios
 AFI Sum:     40.13   cm     > 97  %Tile     Larg Pckt:  13.42   cm
 RUQ:   10.51   cm   RLQ:    6.63   cm    LUQ:   13.42   cm   LLQ:    9.57   cm
Gestational Age

 LMP:           33w 0d        Date:  10/22/13                 EDD:   07/29/14
 Best:          33w 0d     Det. By:  LMP  (10/22/13)          EDD:   07/29/14
Guided Procedures

 Type:   Amniocentesis

 FH Post Procedure:     Normal             RH Type:          A+
 Rh Immune Globulin:    Not required,      Discharge Inst.:  Post-procedure
                        Rh positive                          instructions
                                                             given
 Needle Insertions:     20 gauge x 1       Vol. Withdrawn:   7872 mL
 Transplacental:        No
 Transabdominal:        Yes
 Complications:  None

 Comment:                    Amniocentesis performed after obtaining informed consent and performing  a "time-out"
Impression

 SIUP at 33+0 weeks
 Polyhydramnios

 After a lengthy discussion about whether to go ahead with the
 reduction, Ms. Leal Kimura decided she wanted to proceed.
 Amnioreduction was performed without immediate
 complication - 1.3 liters removed. Ms. Leal Kimura tolerated the
 procedure well. NSTs were reactive before and after the
 reduction. D/C instructions given.
Recommendations

 Follow-up ultrasound for growth and AFI in 2 weeks

 questions or concerns.
